# Patient Record
Sex: Male | Born: 1995 | Hispanic: No | Marital: Single | Smoking: Current some day smoker
Health system: Southern US, Community
[De-identification: ages and names within clinical notes are randomized; demographics above are authoritative.]

## PROBLEM LIST (undated history)

## (undated) ENCOUNTER — Emergency Department: Payer: Medicaid Other | Source: Home / Self Care

---
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
## 2017-04-16 ENCOUNTER — Encounter (HOSPITAL_COMMUNITY): Payer: Self-pay | Admitting: Emergency Medicine

## 2017-04-16 ENCOUNTER — Emergency Department (HOSPITAL_COMMUNITY)
Admission: EM | Admit: 2017-04-16 | Discharge: 2017-04-16 | Disposition: A | Discharge status: Home / Self Care | Payer: Medicaid Other | Attending: Emergency Medicine | Admitting: Emergency Medicine

## 2017-04-16 DIAGNOSIS — M7989 Other specified soft tissue disorders: Secondary | ICD-10-CM

## 2017-04-16 DIAGNOSIS — T7840XA Allergy, unspecified, initial encounter: Secondary | ICD-10-CM | POA: Diagnosis not present

## 2017-04-16 DIAGNOSIS — L509 Urticaria, unspecified: Secondary | ICD-10-CM | POA: Diagnosis not present

## 2017-04-16 DIAGNOSIS — R2233 Localized swelling, mass and lump, upper limb, bilateral: Secondary | ICD-10-CM | POA: Insufficient documentation

## 2017-04-16 DIAGNOSIS — R21 Rash and other nonspecific skin eruption: Secondary | ICD-10-CM | POA: Diagnosis present

## 2017-04-16 MED ORDER — EPINEPHRINE 0.3 MG/0.3ML IJ SOAJ: 0.3000 mg | Freq: Once | INTRAMUSCULAR | 0 refills | Status: AC

## 2017-04-16 MED ORDER — DIPHENHYDRAMINE HCL 25 MG PO CAPS
25.0000 mg | ORAL_CAPSULE | Freq: Once | ORAL | Status: AC
  Administered 2017-04-16: 25 mg via ORAL
  Filled 2017-04-16: qty 1

## 2017-04-16 MED ORDER — FAMOTIDINE 40 MG PO TABS: 40.0000 mg | ORAL_TABLET | Freq: Every day | ORAL | 0 refills | Status: DC

## 2017-04-16 MED ORDER — PREDNISONE 10 MG PO TABS: 40.0000 mg | ORAL_TABLET | Freq: Every day | ORAL | 0 refills | Status: AC

## 2017-04-16 MED ORDER — DIPHENHYDRAMINE HCL 25 MG PO CAPS: 25.0000 mg | ORAL_CAPSULE | Freq: Four times a day (QID) | ORAL | 0 refills | Status: DC | PRN

## 2017-04-16 MED ORDER — FAMOTIDINE 20 MG PO TABS
40.0000 mg | ORAL_TABLET | Freq: Once | ORAL | Status: AC
  Administered 2017-04-16: 40 mg via ORAL
  Filled 2017-04-16: qty 2

## 2017-04-16 MED ORDER — PREDNISONE 20 MG PO TABS
60.0000 mg | ORAL_TABLET | Freq: Once | ORAL | Status: AC
  Administered 2017-04-16: 60 mg via ORAL
  Filled 2017-04-16: qty 3

## 2017-04-16 NOTE — ED Provider Notes (Signed)
[facility]-EMERGENCY DEPT Provider Note   CSN: [id number] Arrival date & time: 04/16/17  1337     History   Chief Complaint Chief Complaint  Patient presents with  . Allergic Reaction    HPI John Reynolds is a 21 y.o. male.  HPI  21 year old male in no pertinent past medical history presents to the emergency department with 1 day of bilateral upper extremity swelling with associated rash. He reports that he first noted swelling of his forehead and scalp last night, shortly after eating dinner (hamburger helper - which is something he has had in the past without complication). Reports that the forehead swelling had resolved this morning, but that is when he noted the bilateral upper extremity swelling. He denies any nausea, vomiting, palpitations, shortness of breath, oral swelling.   Patient denies any prior episodes like this. He is unsure of any allergies. Denies any known exposure to ticks, bees, or insects. Works at a [employer]. No known chemical exposure.  History reviewed. No pertinent past medical history.  There are no active problems to display for this patient.   No past surgical history on file.     Home Medications    Prior to Admission medications   Medication Sig Start Date End Date Taking? Authorizing Provider  diphenhydrAMINE (BENADRYL) 25 mg capsule Take 1 capsule (25 mg total) by mouth every 6 (six) hours as needed for itching or allergies. 04/16/17 04/21/17  Nira Conn, MD  EPINEPHrine 0.3 mg/0.3 mL IJ SOAJ injection Inject 0.3 mLs (0.3 mg total) into the muscle once. 04/16/17 04/16/17  Nira Conn, MD  famotidine (PEPCID) 40 MG tablet Take 1 tablet (40 mg total) by mouth daily. 04/16/17 04/21/17  Nira Conn, MD  predniSONE (DELTASONE) 10 MG tablet Take 4 tablets (40 mg total) by mouth daily. 04/16/17 04/20/17  Nira Conn, MD    Family History Family History  Problem Relation Age of Onset  . Hyperlipidemia  Father   . Hypertension Father   . Cancer Maternal Grandmother     Social History Social History  Substance Use Topics  . Smoking status: Never Smoker  . Smokeless tobacco: Not on file  . Alcohol use No     Allergies   Patient has no known allergies.   Review of Systems Review of Systems All other systems are reviewed and are negative for acute change except as noted in the HPI   Physical Exam Updated Vital Signs BP 133/64   Pulse 100   Temp 98.6 F (37 C) (Oral)   Resp 18   SpO2 100%   Physical Exam  Constitutional: He is oriented to person, place, and time. He appears well-developed and well-nourished. No distress.  HENT:  Head: Normocephalic and atraumatic.  Nose: Nose normal.  Mouth/Throat: Oropharynx is clear and moist and mucous membranes are normal. No oral lesions. No trismus in the jaw. No uvula swelling.  Eyes: Pupils are equal, round, and reactive to light. Conjunctivae and EOM are normal. Right eye exhibits no discharge. Left eye exhibits no discharge. No scleral icterus.  Neck: Normal range of motion and phonation normal. Neck supple.  Cardiovascular: Normal rate and regular rhythm.  Exam reveals no gallop and no friction rub.   No murmur heard. Pulmonary/Chest: Effort normal and breath sounds normal. No stridor. No tachypnea. No respiratory distress. He has no decreased breath sounds. He has no wheezes. He has no rales.  Abdominal: Soft. He exhibits no distension. There is no tenderness.  Musculoskeletal: He exhibits no edema or tenderness.  BUE swelling from hands to elbow. R>L.   Neurological: He is alert and oriented to person, place, and time.  Skin: Skin is warm and dry. Rash noted. Rash is urticarial (Urticaria to BUE, abdomen, BLE.). He is not diaphoretic. No erythema.  Psychiatric: He has a normal mood and affect.  Vitals reviewed.    ED Treatments / Results  Labs (all labs ordered are listed, but only abnormal results are displayed) Labs  Reviewed - No data to display  EKG  EKG Interpretation None       Radiology No results found.  Procedures Procedures (including critical care time)  Medications Ordered in ED Medications  diphenhydrAMINE (BENADRYL) capsule 25 mg (25 mg Oral Given 04/16/17 1520)  famotidine (PEPCID) tablet 40 mg (40 mg Oral Given 04/16/17 1520)  predniSONE (DELTASONE) tablet 60 mg (60 mg Oral Given 04/16/17 1519)     Initial Impression / Assessment and Plan / ED Course  I have reviewed the triage vital signs and the nursing notes.  Pertinent labs & imaging results that were available during my care of the patient were reviewed by me and considered in my medical decision making (see chart for details).     21 y.o. male here with pruritic rash. No known triggers or exposures. No respiratory, GI, or neurologic symptoms to suggest anaphylaxis. No recent infectious symptoms suggestive of viral urticaria.  Patient has not taken benadryl prior to arrival.   On exam, there is no evidence of oral swelling or airway compromise.   Given Benadryl, H2 blocker, and steroids. Monitored for 2 hours. Swelling and urticaria improving. No respiratory distress. Given the fact that the symptoms started over 12 hrs ago, there is no need for prolonged monitoring.  Safe for discharge with strict return precautions. Given Rx for H2 blocker and steroids.   Final Clinical Impressions(s) / ED Diagnoses   Final diagnoses:  Allergic reaction, initial encounter  Hives  Arm swelling   Disposition: Discharge  Condition: Good  I have discussed the results, Dx and Tx plan with the patient who expressed understanding and agree(s) with the plan. Discharge instructions discussed at great length. The patient was given strict return precautions who verbalized understanding of the instructions. No further questions at time of discharge.    New Prescriptions   DIPHENHYDRAMINE (BENADRYL) 25 MG CAPSULE    Take 1 capsule (25 mg  total) by mouth every 6 (six) hours as needed for itching or allergies.   EPINEPHRINE 0.3 MG/0.3 ML IJ SOAJ INJECTION    Inject 0.3 mLs (0.3 mg total) into the muscle once.   FAMOTIDINE (PEPCID) 40 MG TABLET    Take 1 tablet (40 mg total) by mouth daily.   PREDNISONE (DELTASONE) 10 MG TABLET    Take 4 tablets (40 mg total) by mouth daily.    Follow Up: Primary care provider   You will need to be referred to an allergist to figure out what you may be allergic to.      Nira Conn, MD 04/16/17 [phone]

## 2017-04-16 NOTE — ED Triage Notes (Signed)
Pt checked in for arm swelling. Upon assessment pt right arm is tight, swelling to arm and right hand noted. Rash noted to bilateral arms. When asked pt about rash he states "oh yea I had that since this morning I just put cream on it and I put my antibacterial cream on it." Pt assessed for allergic reaction. Denies any known allergies, pt works in a warehouse. Denis trouble breathing, denies sore throat or tongue swelling. Pt now states his eyelids and forehead were swollen yesterday.  Pt is in no acute distress. Has not taken any benadryl. Did take 2 advil for pain to arm. HR ranging from 98-106 at triage.

## 2021-05-20 ENCOUNTER — Emergency Department (HOSPITAL_COMMUNITY)
Admission: EM | Admit: 2021-05-20 | Discharge: 2021-05-20 | Disposition: A | Discharge status: Home / Self Care | Payer: Self-pay | Attending: Emergency Medicine | Admitting: Emergency Medicine

## 2021-05-20 ENCOUNTER — Encounter (HOSPITAL_COMMUNITY): Payer: Self-pay | Admitting: Oncology

## 2021-05-20 ENCOUNTER — Emergency Department (HOSPITAL_COMMUNITY): Payer: Self-pay

## 2021-05-20 ENCOUNTER — Other Ambulatory Visit: Payer: Self-pay

## 2021-05-20 DIAGNOSIS — R1010 Upper abdominal pain, unspecified: Secondary | ICD-10-CM

## 2021-05-20 DIAGNOSIS — R1110 Vomiting, unspecified: Secondary | ICD-10-CM | POA: Insufficient documentation

## 2021-05-20 DIAGNOSIS — R1012 Left upper quadrant pain: Secondary | ICD-10-CM | POA: Insufficient documentation

## 2021-05-20 LAB — COMPREHENSIVE METABOLIC PANEL
ALT: 20 U/L (ref 0–44)
AST: 17 U/L (ref 15–41)
Albumin: 4.5 g/dL (ref 3.5–5.0)
Alkaline Phosphatase: 56 U/L (ref 38–126)
Anion gap: 11 (ref 5–15)
BUN: 14 mg/dL (ref 6–20)
CO2: 22 mmol/L (ref 22–32)
Calcium: 9.2 mg/dL (ref 8.9–10.3)
Chloride: 103 mmol/L (ref 98–111)
Creatinine, Ser: 0.84 mg/dL (ref 0.61–1.24)
GFR, Estimated: >60 mL/min (ref >60)
Glucose, Bld: 96 mg/dL (ref 70–99)
Potassium: 3.6 mmol/L (ref 3.5–5.1)
Sodium: 136 mmol/L (ref 135–145)
Total Bilirubin: 1 mg/dL (ref 0.3–1.2)
Total Protein: 7.7 g/dL (ref 6.5–8.1)

## 2021-05-20 LAB — CBC
HCT: 43.5 % (ref 39.0–52.0)
Hemoglobin: 15.5 g/dL (ref 13.0–17.0)
MCH: 32 pg (ref 26.0–34.0)
MCHC: 35.6 g/dL (ref 30.0–36.0)
MCV: 89.9 fL (ref 80.0–100.0)
Platelets: 235 10*3/uL (ref 150–400)
RBC: 4.84 MIL/uL (ref 4.22–5.81)
RDW: 12.5 % (ref 11.5–15.5)
WBC: 12.5 10*3/uL — ABNORMAL HIGH (ref 4.0–10.5)
nRBC: 0 % (ref 0.0–0.2)

## 2021-05-20 LAB — LIPASE, BLOOD: Lipase: 23 U/L (ref 11–51)

## 2021-05-20 MED ORDER — IOHEXOL 350 MG/ML SOLN
75.0000 mL | Freq: Once | INTRAVENOUS | Status: AC | PRN
  Administered 2021-05-20: 75 mL via INTRAVENOUS

## 2021-05-20 MED ORDER — SODIUM CHLORIDE 0.9 % IV BOLUS
1000.0000 mL | Freq: Once | INTRAVENOUS | Status: AC
  Administered 2021-05-20: 1000 mL via INTRAVENOUS

## 2021-05-20 MED ORDER — METOCLOPRAMIDE HCL 5 MG/ML IJ SOLN
10.0000 mg | Freq: Once | INTRAMUSCULAR | Status: AC
  Administered 2021-05-20: 10 mg via INTRAVENOUS
  Filled 2021-05-20: qty 2

## 2021-05-20 MED ORDER — ONDANSETRON 4 MG PO TBDP: 4.0000 mg | ORAL_TABLET | Freq: Three times a day (TID) | ORAL | 0 refills | Status: AC | PRN

## 2021-05-20 MED ORDER — HYDROMORPHONE HCL 1 MG/ML IJ SOLN
1.0000 mg | Freq: Once | INTRAMUSCULAR | Status: AC
  Administered 2021-05-20: 1 mg via INTRAVENOUS
  Filled 2021-05-20: qty 1

## 2021-05-20 MED ORDER — PANTOPRAZOLE SODIUM 40 MG PO TBEC: 40.0000 mg | DELAYED_RELEASE_TABLET | Freq: Every day | ORAL | 0 refills | Status: AC

## 2021-05-20 NOTE — ED Triage Notes (Signed)
Pt bib [facility] d/t LUQ abdominal pain and vomiting that began last night.  Pt given 100 mcg of fentanyl and 350 ml's of NS en route.

## 2021-05-20 NOTE — Discharge Instructions (Signed)
If you develop worsening, continued, or recurrent abdominal pain, uncontrolled vomiting, fever, chest or back pain, or any other new/concerning symptoms then return to the ER for evaluation.  

## 2021-05-20 NOTE — ED Provider Notes (Signed)
[facility]-EMERGENCY DEPT Provider Note   CSN: [id number] Arrival date & time: 05/20/21  1050     History Chief Complaint  Patient presents with   Abdominal Pain    John Reynolds is a 25 y.o. male.  HPI 25 year old male presents with left upper quadrant abdominal pain.  Started last night around 8 PM.  It is sharp and severe.  Somewhat of a burning sensation.  He has not had a fever but has had numerous episodes of emesis.  Is mostly yellow with specks of blood.  No diarrhea or back pain.  No urinary symptoms.  No chest pain.  He does endorse using marijuana once every couple weeks.  He denies any significant alcohol use.  Pain is rated as severe currently.  He was given IV fentanyl by EMS.  History reviewed. No pertinent past medical history.  There are no problems to display for this patient.   History reviewed. No pertinent surgical history.     Family History  Problem Relation Age of Onset   Hyperlipidemia Father    Hypertension Father    Cancer Maternal Grandmother     Social History   Tobacco Use   Smoking status: Never   Smokeless tobacco: Never  Substance Use Topics   Alcohol use: No    Home Medications Prior to Admission medications   Medication Sig Start Date End Date Taking? Authorizing Provider  ondansetron (ZOFRAN ODT) 4 MG disintegrating tablet Take 1 tablet (4 mg total) by mouth every 8 (eight) hours as needed for nausea or vomiting. 05/20/21  Yes Pricilla Loveless, MD  pantoprazole (PROTONIX) 40 MG tablet Take 1 tablet (40 mg total) by mouth daily. 05/20/21  Yes Pricilla Loveless, MD  diphenhydrAMINE (BENADRYL) 25 mg capsule Take 1 capsule (25 mg total) by mouth every 6 (six) hours as needed for itching or allergies. 04/16/17 04/21/17  Nira Conn, MD  famotidine (PEPCID) 40 MG tablet Take 1 tablet (40 mg total) by mouth daily. 04/16/17 04/21/17  Nira Conn, MD    Allergies    Patient has no known  allergies.  Review of Systems   Review of Systems  Constitutional:  Negative for fever.  Cardiovascular:  Negative for chest pain.  Gastrointestinal:  Positive for abdominal pain, nausea and vomiting.  Musculoskeletal:  Negative for back pain.  All other systems reviewed and are negative.  Physical Exam Updated Vital Signs BP 108/61   Pulse (!) 57   Temp 98.6 F (37 C) (Oral)   Resp 18   SpO2 100%   Physical Exam Vitals and nursing note reviewed.  Constitutional:      Appearance: He is well-developed.     Comments: Patient appears uncomfortable, hard for him to lay still  HENT:     Head: Normocephalic and atraumatic.     Right Ear: External ear normal.     Left Ear: External ear normal.     Nose: Nose normal.  Eyes:     General:        Right eye: No discharge.        Left eye: No discharge.  Cardiovascular:     Rate and Rhythm: Normal rate and regular rhythm.     Heart sounds: Normal heart sounds.  Pulmonary:     Effort: Pulmonary effort is normal.     Breath sounds: Normal breath sounds.  Abdominal:     Palpations: Abdomen is soft.     Tenderness: There is abdominal tenderness in the  left upper quadrant.  Musculoskeletal:     Cervical back: Neck supple.  Skin:    General: Skin is warm and dry.  Neurological:     Mental Status: He is alert.  Psychiatric:        Mood and Affect: Mood is not anxious.    ED Results / Procedures / Treatments   Labs (all labs ordered are listed, but only abnormal results are displayed) Labs Reviewed  CBC - Abnormal; Notable for the following components:      Result Value   WBC 12.5 (*)    All other components within normal limits  LIPASE, BLOOD  COMPREHENSIVE METABOLIC PANEL  URINALYSIS, ROUTINE W REFLEX MICROSCOPIC    EKG EKG Interpretation  Date/Time:  Thursday May 20 2021 12:02:58 EST Ventricular Rate:  50 PR Interval:  145 QRS Duration: 93 QT Interval:  426 QTC Calculation: 389 R Axis:   80 Text  Interpretation: Sinus rhythm Atrial premature complex no acute ST/T changes Confirmed by Pricilla Loveless [phone]) on 05/20/2021 12:06:20 PM  Radiology CT ABDOMEN PELVIS W CONTRAST  Result Date: 05/20/2021 CLINICAL DATA:  Nausea and vomiting.  Left upper quadrant pain. EXAM: CT ABDOMEN AND PELVIS WITH CONTRAST TECHNIQUE: Multidetector CT imaging of the abdomen and pelvis was performed using the standard protocol following bolus administration of intravenous contrast. CONTRAST:  27mL OMNIPAQUE IOHEXOL 350 MG/ML SOLN COMPARISON:  None. FINDINGS: Lower chest: No acute abnormality. Hepatobiliary: No focal liver abnormality is seen. No gallstones, gallbladder wall thickening, or biliary dilatation. Pancreas: Unremarkable. No pancreatic ductal dilatation or surrounding inflammatory changes. Spleen: Normal in size without focal abnormality. Adrenals/Urinary Tract: Adrenal glands are unremarkable. Kidneys are normal, without renal calculi, focal lesion, or hydronephrosis. Bladder is unremarkable. Stomach/Bowel: Stomach appears normal. The appendix is visualized and appears normal. No signs of bowel wall thickening, inflammation, or distension. Vascular/Lymphatic: No significant vascular findings are present. No enlarged abdominal or pelvic lymph nodes. Reproductive: Prostate is unremarkable. Other: No free fluid or fluid collections identified. Musculoskeletal: No acute or significant osseous findings. IMPRESSION: No acute findings within the abdomen or pelvis. Electronically Signed   By: Signa Kell M.D.   On: 05/20/2021 14:25    Procedures Procedures   Medications Ordered in ED Medications  sodium chloride 0.9 % bolus 1,000 mL (0 mLs Intravenous Stopped 05/20/21 1448)  HYDROmorphone (DILAUDID) injection 1 mg (1 mg Intravenous Given 05/20/21 1208)  metoCLOPramide (REGLAN) injection 10 mg (10 mg Intravenous Given 05/20/21 1208)  iohexol (OMNIPAQUE) 350 MG/ML injection 75 mL (75 mLs Intravenous Contrast  Given 05/20/21 1410)    ED Course  I have reviewed the triage vital signs and the nursing notes.  Pertinent labs & imaging results that were available during my care of the patient were reviewed by me and considered in my medical decision making (see chart for details).    MDM Rules/Calculators/A&P                           Patient feels much better after treatment with IV Dilaudid and Reglan.  No vomiting.  He does have some specks of blood in his emesis earlier but I suspect this is from the vomiting itself rather than true hematemesis or esophageal perforation.  Unclear cause but with a primary ring left upper quadrant think is reasonable to treat as potentially gastritis with Protonix and Zofran and have him follow-up with his PCP.  Given return precautions. Final Clinical Impression(s) / ED Diagnoses Final diagnoses:  Vomiting in adult  Upper abdominal pain    Rx / DC Orders ED Discharge Orders          Ordered    ondansetron (ZOFRAN ODT) 4 MG disintegrating tablet  Every 8 hours PRN        05/20/21 1432    pantoprazole (PROTONIX) 40 MG tablet  Daily        05/20/21 1432             Pricilla Loveless, MD 05/20/21 1512

## 2021-05-20 NOTE — ED Notes (Signed)
Pt given urinal, will give sample when able too.

## 2021-05-21 ENCOUNTER — Other Ambulatory Visit: Payer: Self-pay

## 2021-05-21 ENCOUNTER — Emergency Department (HOSPITAL_COMMUNITY)
Admission: EM | Admit: 2021-05-21 | Discharge: 2021-05-21 | Disposition: A | Discharge status: Home / Self Care | Payer: Medicaid Other | Attending: Emergency Medicine | Admitting: Emergency Medicine

## 2021-05-21 ENCOUNTER — Encounter (HOSPITAL_COMMUNITY): Payer: Self-pay

## 2021-05-21 DIAGNOSIS — R112 Nausea with vomiting, unspecified: Secondary | ICD-10-CM | POA: Insufficient documentation

## 2021-05-21 DIAGNOSIS — Z79899 Other long term (current) drug therapy: Secondary | ICD-10-CM | POA: Insufficient documentation

## 2021-05-21 DIAGNOSIS — F17210 Nicotine dependence, cigarettes, uncomplicated: Secondary | ICD-10-CM | POA: Insufficient documentation

## 2021-05-21 DIAGNOSIS — R1012 Left upper quadrant pain: Secondary | ICD-10-CM | POA: Insufficient documentation

## 2021-05-21 DIAGNOSIS — Z20822 Contact with and (suspected) exposure to covid-19: Secondary | ICD-10-CM | POA: Insufficient documentation

## 2021-05-21 LAB — URINALYSIS, ROUTINE W REFLEX MICROSCOPIC
Bacteria, UA: NONE SEEN
Bilirubin Urine: NEGATIVE
Glucose, UA: NEGATIVE mg/dL
Hgb urine dipstick: NEGATIVE
Ketones, ur: 80 mg/dL — AB
Leukocytes,Ua: NEGATIVE
Nitrite: NEGATIVE
Protein, ur: 30 mg/dL — AB
Specific Gravity, Urine: 1.035 — ABNORMAL HIGH (ref 1.005–1.030)
pH: 5 (ref 5.0–8.0)

## 2021-05-21 LAB — COMPREHENSIVE METABOLIC PANEL
ALT: 19 U/L (ref 0–44)
AST: 17 U/L (ref 15–41)
Albumin: 4 g/dL (ref 3.5–5.0)
Alkaline Phosphatase: 53 U/L (ref 38–126)
Anion gap: 10 (ref 5–15)
BUN: 13 mg/dL (ref 6–20)
CO2: 23 mmol/L (ref 22–32)
Calcium: 9 mg/dL (ref 8.9–10.3)
Chloride: 103 mmol/L (ref 98–111)
Creatinine, Ser: 1 mg/dL (ref 0.61–1.24)
GFR, Estimated: >60 mL/min (ref >60)
Glucose, Bld: 98 mg/dL (ref 70–99)
Potassium: 3.3 mmol/L — ABNORMAL LOW (ref 3.5–5.1)
Sodium: 136 mmol/L (ref 135–145)
Total Bilirubin: 1 mg/dL (ref 0.3–1.2)
Total Protein: 6.9 g/dL (ref 6.5–8.1)

## 2021-05-21 LAB — CBC WITH DIFFERENTIAL/PLATELET
Abs Immature Granulocytes: 0.06 10*3/uL (ref 0.00–0.07)
Basophils Absolute: 0 10*3/uL (ref 0.0–0.1)
Basophils Relative: 0 %
Eosinophils Absolute: 0.1 10*3/uL (ref 0.0–0.5)
Eosinophils Relative: 0 %
HCT: 42.5 % (ref 39.0–52.0)
Hemoglobin: 15.3 g/dL (ref 13.0–17.0)
Immature Granulocytes: 0 %
Lymphocytes Relative: 13 %
Lymphs Abs: 1.8 10*3/uL (ref 0.7–4.0)
MCH: 32 pg (ref 26.0–34.0)
MCHC: 36 g/dL (ref 30.0–36.0)
MCV: 88.9 fL (ref 80.0–100.0)
Monocytes Absolute: 1 10*3/uL (ref 0.1–1.0)
Monocytes Relative: 7 %
Neutro Abs: 11.5 10*3/uL — ABNORMAL HIGH (ref 1.7–7.7)
Neutrophils Relative %: 80 %
Platelets: 217 10*3/uL (ref 150–400)
RBC: 4.78 MIL/uL (ref 4.22–5.81)
RDW: 12.5 % (ref 11.5–15.5)
WBC: 14.4 10*3/uL — ABNORMAL HIGH (ref 4.0–10.5)
nRBC: 0 % (ref 0.0–0.2)

## 2021-05-21 LAB — RESP PANEL BY RT-PCR (FLU A&B, COVID) ARPGX2
Influenza A by PCR: NEGATIVE
Influenza B by PCR: NEGATIVE
SARS Coronavirus 2 by RT PCR: NEGATIVE

## 2021-05-21 LAB — RAPID URINE DRUG SCREEN, HOSP PERFORMED
Amphetamines: NOT DETECTED
Barbiturates: NOT DETECTED
Benzodiazepines: NOT DETECTED
Cocaine: NOT DETECTED
Opiates: POSITIVE — AB
Tetrahydrocannabinol: POSITIVE — AB

## 2021-05-21 LAB — LIPASE, BLOOD: Lipase: 27 U/L (ref 11–51)

## 2021-05-21 LAB — LACTIC ACID, PLASMA: Lactic Acid, Venous: 1 mmol/L (ref 0.5–1.9)

## 2021-05-21 MED ORDER — HYDROMORPHONE HCL 1 MG/ML IJ SOLN
1.0000 mg | Freq: Once | INTRAMUSCULAR | Status: AC
  Administered 2021-05-21: 1 mg via INTRAVENOUS
  Filled 2021-05-21: qty 1

## 2021-05-21 MED ORDER — HALOPERIDOL LACTATE 5 MG/ML IJ SOLN: 2.0000 mg | Freq: Once | INTRAMUSCULAR | Status: DC

## 2021-05-21 MED ORDER — HALOPERIDOL LACTATE 5 MG/ML IJ SOLN
2.5000 mg | Freq: Once | INTRAMUSCULAR | Status: AC
  Administered 2021-05-21: 2.5 mg via INTRAVENOUS
  Filled 2021-05-21: qty 1

## 2021-05-21 MED ORDER — SODIUM CHLORIDE 0.9 % IV BOLUS
1000.0000 mL | Freq: Once | INTRAVENOUS | Status: AC
  Administered 2021-05-21: 1000 mL via INTRAVENOUS

## 2021-05-21 NOTE — ED Triage Notes (Signed)
Patient BIB [facility] from home. Was seen yesterday at [facility] for abdominal pain that has you not resolved. Generalized. Pain began at 1:40am, vomited before EMS. Pain is worse than initial onset. Diaphoretic.   EMS 18G left AC Fentanyl (pain went from 10 to 8).  HR 80 94% Room air BP 106/74

## 2021-05-21 NOTE — ED Provider Notes (Signed)
[facility]-EMERGENCY DEPT Provider Note: John Dell, MD, FACEP  CSN: [id number] MRN: [id number] ARRIVAL: 05/21/21 at 0310 ROOM: WA15/WA15   CHIEF COMPLAINT  Abdominal Pain   HISTORY OF PRESENT ILLNESS  05/21/21 3:19 AM John Reynolds is a 25 y.o. male who was seen for abdominal pain yesterday afternoon.  The pain was in the left upper quadrant and it started the previous evening about 8 PM.  It was described as sharp and severe.  He had associated nausea and vomiting but no diarrhea.  Results of diagnostic studies from yesterday are below:  Results for orders placed or performed during the hospital encounter of 05/20/21 (from the past 24 hour(s))  Lipase, blood     Status: None   Collection Time: 05/20/21 12:11 PM  Result Value Ref Range   Lipase 23 11 - 51 U/L  Comprehensive metabolic panel     Status: None   Collection Time: 05/20/21 12:11 PM  Result Value Ref Range   Sodium 136 135 - 145 mmol/L   Potassium 3.6 3.5 - 5.1 mmol/L   Chloride 103 98 - 111 mmol/L   CO2 22 22 - 32 mmol/L   Glucose, Bld 96 70 - 99 mg/dL   BUN 14 6 - 20 mg/dL   Creatinine, Ser 6.43 0.61 - 1.24 mg/dL   Calcium 9.2 8.9 - 32.9 mg/dL   Total Protein 7.7 6.5 - 8.1 g/dL   Albumin 4.5 3.5 - 5.0 g/dL   AST 17 15 - 41 U/L   ALT 20 0 - 44 U/L   Alkaline Phosphatase 56 38 - 126 U/L   Total Bilirubin 1.0 0.3 - 1.2 mg/dL   GFR, Estimated >51 >88 mL/min   Anion gap 11 5 - 15  CBC     Status: Abnormal   Collection Time: 05/20/21 12:11 PM  Result Value Ref Range   WBC 12.5 (H) 4.0 - 10.5 K/uL   RBC 4.84 4.22 - 5.81 MIL/uL   Hemoglobin 15.5 13.0 - 17.0 g/dL   HCT 41.6 60.6 - 30.1 %   MCV 89.9 80.0 - 100.0 fL   MCH 32.0 26.0 - 34.0 pg   MCHC 35.6 30.0 - 36.0 g/dL   RDW 60.1 09.3 - 23.5 %   Platelets 235 150 - 400 K/uL   nRBC 0.0 0.0 - 0.2 %   CT ABDOMEN PELVIS W CONTRAST  Result Date: 05/20/2021 CLINICAL DATA:  Nausea and vomiting.  Left upper quadrant pain. EXAM: CT ABDOMEN AND PELVIS WITH CONTRAST  TECHNIQUE: Multidetector CT imaging of the abdomen and pelvis was performed using the standard protocol following bolus administration of intravenous contrast. CONTRAST:  85mL OMNIPAQUE IOHEXOL 350 MG/ML SOLN COMPARISON:  None. FINDINGS: Lower chest: No acute abnormality. Hepatobiliary: No focal liver abnormality is seen. No gallstones, gallbladder wall thickening, or biliary dilatation. Pancreas: Unremarkable. No pancreatic ductal dilatation or surrounding inflammatory changes. Spleen: Normal in size without focal abnormality. Adrenals/Urinary Tract: Adrenal glands are unremarkable. Kidneys are normal, without renal calculi, focal lesion, or hydronephrosis. Bladder is unremarkable. Stomach/Bowel: Stomach appears normal. The appendix is visualized and appears normal. No signs of bowel wall thickening, inflammation, or distension. Vascular/Lymphatic: No significant vascular findings are present. No enlarged abdominal or pelvic lymph nodes. Reproductive: Prostate is unremarkable. Other: No free fluid or fluid collections identified. Musculoskeletal: No acute or significant osseous findings. IMPRESSION: No acute findings within the abdomen or pelvis. Electronically Signed   By: Signa Kell M.D.   On: 05/20/2021 14:25    He returns  with a recurrence of abdominal pain that began about 1:40 AM today.  The pain is still in the left upper quadrant but now severe.  He rated it as a 10 out of 10, worse than yesterday.  He had had vomiting (retching) with this and EMS found him to be diaphoretic.  He was given 200 mcg of fentanyl IV by EMS and his pain is now an 8 out of 10.  He describes it as aching and constant.  The pain is only somewhat worse with palpation.  He denies frequent marijuana use and has not smoked marijuana in about 3 days.   History reviewed. No pertinent past medical history.  History reviewed. No pertinent surgical history.  Family History  Problem Relation Age of Onset   Hyperlipidemia  Father    Hypertension Father    Cancer Maternal Grandmother     Social History   Tobacco Use   Smoking status: Some Days    Types: Cigarettes   Smokeless tobacco: Never  Substance Use Topics   Alcohol use: No   Drug use: Yes    Types: Marijuana    Comment: occassionally    Prior to Admission medications   Medication Sig Start Date End Date Taking? Authorizing Provider  ondansetron (ZOFRAN ODT) 4 MG disintegrating tablet Take 1 tablet (4 mg total) by mouth every 8 (eight) hours as needed for nausea or vomiting. 05/20/21   Pricilla Loveless, MD  pantoprazole (PROTONIX) 40 MG tablet Take 1 tablet (40 mg total) by mouth daily. 05/20/21   Pricilla Loveless, MD    Allergies Patient has no known allergies.   REVIEW OF SYSTEMS  Negative except as noted here or in the History of Present Illness.   PHYSICAL EXAMINATION  Initial Vital Signs Blood pressure 137/60, pulse 87, temperature 98.6 F (37 C), temperature source Oral, resp. rate 17, height 5' 7.5" (1.715 m), weight 77.1 kg, SpO2 96 %.  Examination General: Well-developed, well-nourished male in no acute distress; appearance consistent with age of record HENT: normocephalic; atraumatic Eyes: Normal appearance Neck: supple Heart: regular rate and rhythm Lungs: clear to auscultation bilaterally Abdomen: Nondistended; diffusely tender most prominent in the left upper quadrant; bowel sounds hypoactive Extremities: No deformity; full range of motion; pulses normal Neurologic: Awake, alert and oriented; motor function intact in all extremities and symmetric; no facial droop Skin: Warm and dry Psychiatric: Agitated; writhing   RESULTS  Summary of this visit's results, reviewed and interpreted by myself:   EKG Interpretation  Date/Time:    Ventricular Rate:    PR Interval:    QRS Duration:   QT Interval:    QTC Calculation:   R Axis:     Text Interpretation:         Laboratory Studies: Results for orders placed or  performed during the hospital encounter of 05/21/21 (from the past 24 hour(s))  CBC with Differential/Platelet     Status: Abnormal   Collection Time: 05/21/21  3:43 AM  Result Value Ref Range   WBC 14.4 (H) 4.0 - 10.5 K/uL   RBC 4.78 4.22 - 5.81 MIL/uL   Hemoglobin 15.3 13.0 - 17.0 g/dL   HCT 56.3 89.3 - 73.4 %   MCV 88.9 80.0 - 100.0 fL   MCH 32.0 26.0 - 34.0 pg   MCHC 36.0 30.0 - 36.0 g/dL   RDW 28.7 68.1 - 15.7 %   Platelets 217 150 - 400 K/uL   nRBC 0.0 0.0 - 0.2 %   Neutrophils Relative %  80 %   Neutro Abs 11.5 (H) 1.7 - 7.7 K/uL   Lymphocytes Relative 13 %   Lymphs Abs 1.8 0.7 - 4.0 K/uL   Monocytes Relative 7 %   Monocytes Absolute 1.0 0.1 - 1.0 K/uL   Eosinophils Relative 0 %   Eosinophils Absolute 0.1 0.0 - 0.5 K/uL   Basophils Relative 0 %   Basophils Absolute 0.0 0.0 - 0.1 K/uL   Immature Granulocytes 0 %   Abs Immature Granulocytes 0.06 0.00 - 0.07 K/uL  Comprehensive metabolic panel     Status: Abnormal   Collection Time: 05/21/21  3:43 AM  Result Value Ref Range   Sodium 136 135 - 145 mmol/L   Potassium 3.3 (L) 3.5 - 5.1 mmol/L   Chloride 103 98 - 111 mmol/L   CO2 23 22 - 32 mmol/L   Glucose, Bld 98 70 - 99 mg/dL   BUN 13 6 - 20 mg/dL   Creatinine, Ser 5.10 0.61 - 1.24 mg/dL   Calcium 9.0 8.9 - 25.8 mg/dL   Total Protein 6.9 6.5 - 8.1 g/dL   Albumin 4.0 3.5 - 5.0 g/dL   AST 17 15 - 41 U/L   ALT 19 0 - 44 U/L   Alkaline Phosphatase 53 38 - 126 U/L   Total Bilirubin 1.0 0.3 - 1.2 mg/dL   GFR, Estimated >52 >77 mL/min   Anion gap 10 5 - 15  Lipase, blood     Status: None   Collection Time: 05/21/21  3:43 AM  Result Value Ref Range   Lipase 27 11 - 51 U/L  Lactic acid, plasma     Status: None   Collection Time: 05/21/21  3:43 AM  Result Value Ref Range   Lactic Acid, Venous 1.0 0.5 - 1.9 mmol/L  Resp Panel by RT-PCR (Flu A&B, Covid) Nasopharyngeal Swab     Status: None   Collection Time: 05/21/21  3:50 AM   Specimen: Nasopharyngeal Swab;  Nasopharyngeal(NP) swabs in vial transport medium  Result Value Ref Range   SARS Coronavirus 2 by RT PCR NEGATIVE NEGATIVE   Influenza A by PCR NEGATIVE NEGATIVE   Influenza B by PCR NEGATIVE NEGATIVE  Urinalysis, Routine w reflex microscopic     Status: Abnormal   Collection Time: 05/21/21  4:35 AM  Result Value Ref Range   Color, Urine YELLOW YELLOW   APPearance CLEAR CLEAR   Specific Gravity, Urine 1.035 (H) 1.005 - 1.030   pH 5.0 5.0 - 8.0   Glucose, UA NEGATIVE NEGATIVE mg/dL   Hgb urine dipstick NEGATIVE NEGATIVE   Bilirubin Urine NEGATIVE NEGATIVE   Ketones, ur 80 (A) NEGATIVE mg/dL   Protein, ur 30 (A) NEGATIVE mg/dL   Nitrite NEGATIVE NEGATIVE   Leukocytes,Ua NEGATIVE NEGATIVE   RBC / HPF 0-5 0 - 5 RBC/hpf   WBC, UA 0-5 0 - 5 WBC/hpf   Bacteria, UA NONE SEEN NONE SEEN   Squamous Epithelial / LPF 0-5 0 - 5   Mucus PRESENT   Rapid urine drug screen (hospital performed)     Status: Abnormal   Collection Time: 05/21/21  4:35 AM  Result Value Ref Range   Opiates POSITIVE (A) NONE DETECTED   Cocaine NONE DETECTED NONE DETECTED   Benzodiazepines NONE DETECTED NONE DETECTED   Amphetamines NONE DETECTED NONE DETECTED   Tetrahydrocannabinol POSITIVE (A) NONE DETECTED   Barbiturates NONE DETECTED NONE DETECTED   Imaging Studies: CT ABDOMEN PELVIS W CONTRAST  Result Date: 05/20/2021 CLINICAL DATA:  Nausea  and vomiting.  Left upper quadrant pain. EXAM: CT ABDOMEN AND PELVIS WITH CONTRAST TECHNIQUE: Multidetector CT imaging of the abdomen and pelvis was performed using the standard protocol following bolus administration of intravenous contrast. CONTRAST:  71mL OMNIPAQUE IOHEXOL 350 MG/ML SOLN COMPARISON:  None. FINDINGS: Lower chest: No acute abnormality. Hepatobiliary: No focal liver abnormality is seen. No gallstones, gallbladder wall thickening, or biliary dilatation. Pancreas: Unremarkable. No pancreatic ductal dilatation or surrounding inflammatory changes. Spleen: Normal in  size without focal abnormality. Adrenals/Urinary Tract: Adrenal glands are unremarkable. Kidneys are normal, without renal calculi, focal lesion, or hydronephrosis. Bladder is unremarkable. Stomach/Bowel: Stomach appears normal. The appendix is visualized and appears normal. No signs of bowel wall thickening, inflammation, or distension. Vascular/Lymphatic: No significant vascular findings are present. No enlarged abdominal or pelvic lymph nodes. Reproductive: Prostate is unremarkable. Other: No free fluid or fluid collections identified. Musculoskeletal: No acute or significant osseous findings. IMPRESSION: No acute findings within the abdomen or pelvis. Electronically Signed   By: Signa Kell M.D.   On: 05/20/2021 14:25    ED COURSE and MDM  Nursing notes, initial and subsequent vitals signs, including pulse oximetry, reviewed and interpreted by myself.  Vitals:   05/21/21 0315 05/21/21 0318 05/21/21 0430 05/21/21 0500  BP:  137/60 112/69 127/61  Pulse:  87 (!) 46 (!) 54  Resp:  17 (!) 22 17  Temp:  98.6 F (37 C)    TempSrc:  Oral    SpO2:  96% 96% 99%  Weight: 77.1 kg     Height: 5' 7.5" (1.715 m)      Medications  sodium chloride 0.9 % bolus 1,000 mL (0 mLs Intravenous Stopped 05/21/21 0432)  HYDROmorphone (DILAUDID) injection 1 mg (1 mg Intravenous Given 05/21/21 0346)  haloperidol lactate (HALDOL) injection 2.5 mg (2.5 mg Intravenous Given 05/21/21 0345)   5:29 AM Patient feeling difficulty better after IV medications.  He tested positive for [facility] and admitted to nursing staff that he has been smoking marijuana.  Nursing staff also reports he smells of marijuana smoke.  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   PROCEDURES  Procedures   ED DIAGNOSES     ICD-10-CM   1. Left upper quadrant abdominal pain  R10.12          Debanhi Blaker, Jonny Ruiz, MD 05/21/21 0530

## 2021-08-22 ENCOUNTER — Emergency Department (HOSPITAL_COMMUNITY)
Admission: EM | Admit: 2021-08-22 | Discharge: 2021-08-22 | Disposition: A | Discharge status: Home / Self Care | Payer: Medicaid Other | Attending: Emergency Medicine | Admitting: Emergency Medicine

## 2021-08-22 DIAGNOSIS — R112 Nausea with vomiting, unspecified: Secondary | ICD-10-CM | POA: Insufficient documentation

## 2021-08-22 DIAGNOSIS — R1010 Upper abdominal pain, unspecified: Secondary | ICD-10-CM

## 2021-08-22 DIAGNOSIS — R1013 Epigastric pain: Secondary | ICD-10-CM | POA: Insufficient documentation

## 2021-08-22 DIAGNOSIS — R1012 Left upper quadrant pain: Secondary | ICD-10-CM | POA: Insufficient documentation

## 2021-08-22 DIAGNOSIS — R1011 Right upper quadrant pain: Secondary | ICD-10-CM | POA: Insufficient documentation

## 2021-08-22 LAB — COMPREHENSIVE METABOLIC PANEL
ALT: 25 U/L (ref 0–44)
AST: 19 U/L (ref 15–41)
Albumin: 4.9 g/dL (ref 3.5–5.0)
Alkaline Phosphatase: 60 U/L (ref 38–126)
Anion gap: 7 (ref 5–15)
BUN: 13 mg/dL (ref 6–20)
CO2: 23 mmol/L (ref 22–32)
Calcium: 9.6 mg/dL (ref 8.9–10.3)
Chloride: 103 mmol/L (ref 98–111)
Creatinine, Ser: 0.99 mg/dL (ref 0.61–1.24)
GFR, Estimated: >60 mL/min (ref >60)
Glucose, Bld: 108 mg/dL — ABNORMAL HIGH (ref 70–99)
Potassium: 3.3 mmol/L — ABNORMAL LOW (ref 3.5–5.1)
Sodium: 133 mmol/L — ABNORMAL LOW (ref 135–145)
Total Bilirubin: 1.2 mg/dL (ref 0.3–1.2)
Total Protein: 8 g/dL (ref 6.5–8.1)

## 2021-08-22 LAB — CBC WITH DIFFERENTIAL/PLATELET
Abs Immature Granulocytes: 0.09 10*3/uL — ABNORMAL HIGH (ref 0.00–0.07)
Basophils Absolute: 0 10*3/uL (ref 0.0–0.1)
Basophils Relative: 0 %
Eosinophils Absolute: 0 10*3/uL (ref 0.0–0.5)
Eosinophils Relative: 0 %
HCT: 46.4 % (ref 39.0–52.0)
Hemoglobin: 16.5 g/dL (ref 13.0–17.0)
Immature Granulocytes: 1 %
Lymphocytes Relative: 10 %
Lymphs Abs: 1.6 10*3/uL (ref 0.7–4.0)
MCH: 31.5 pg (ref 26.0–34.0)
MCHC: 35.6 g/dL (ref 30.0–36.0)
MCV: 88.7 fL (ref 80.0–100.0)
Monocytes Absolute: 1.3 10*3/uL — ABNORMAL HIGH (ref 0.1–1.0)
Monocytes Relative: 8 %
Neutro Abs: 12.7 10*3/uL — ABNORMAL HIGH (ref 1.7–7.7)
Neutrophils Relative %: 81 %
Platelets: 264 10*3/uL (ref 150–400)
RBC: 5.23 MIL/uL (ref 4.22–5.81)
RDW: 12.2 % (ref 11.5–15.5)
WBC: 15.7 10*3/uL — ABNORMAL HIGH (ref 4.0–10.5)
nRBC: 0 % (ref 0.0–0.2)

## 2021-08-22 LAB — LIPASE, BLOOD: Lipase: 35 U/L (ref 11–51)

## 2021-08-22 MED ORDER — CAPSAICIN 0.025 % EX CREA
TOPICAL_CREAM | Freq: Once | CUTANEOUS | Status: AC
  Filled 2021-08-22: qty 60

## 2021-08-22 MED ORDER — SODIUM CHLORIDE 0.9 % IV BOLUS
1000.0000 mL | Freq: Once | INTRAVENOUS | Status: AC
  Administered 2021-08-22: 1000 mL via INTRAVENOUS

## 2021-08-22 MED ORDER — FAMOTIDINE IN NACL 20-0.9 MG/50ML-% IV SOLN
20.0000 mg | Freq: Once | INTRAVENOUS | Status: AC
  Administered 2021-08-22: 20 mg via INTRAVENOUS
  Filled 2021-08-22: qty 50

## 2021-08-22 MED ORDER — ONDANSETRON HCL 4 MG/2ML IJ SOLN
4.0000 mg | Freq: Once | INTRAMUSCULAR | Status: AC
  Administered 2021-08-22: 4 mg via INTRAVENOUS
  Filled 2021-08-22: qty 2

## 2021-08-22 MED ORDER — DICYCLOMINE HCL 10 MG/ML IM SOLN
20.0000 mg | Freq: Once | INTRAMUSCULAR | Status: AC
  Administered 2021-08-22: 20 mg via INTRAMUSCULAR
  Filled 2021-08-22: qty 2

## 2021-08-22 MED ORDER — DROPERIDOL 2.5 MG/ML IJ SOLN
1.2500 mg | Freq: Once | INTRAMUSCULAR | Status: AC
  Administered 2021-08-22: 1.25 mg via INTRAVENOUS
  Filled 2021-08-22: qty 2

## 2021-08-22 MED ORDER — METOCLOPRAMIDE HCL 10 MG PO TABS: 10.0000 mg | ORAL_TABLET | Freq: Four times a day (QID) | ORAL | 0 refills | Status: AC

## 2021-08-22 MED ORDER — DICYCLOMINE HCL 20 MG PO TABS: 20.0000 mg | ORAL_TABLET | Freq: Two times a day (BID) | ORAL | 0 refills | Status: AC

## 2021-08-22 NOTE — ED Triage Notes (Signed)
Ems brings pt in for abdominal pain.pt reports upper abdominal pain and vomiting.

## 2021-08-22 NOTE — ED Provider Notes (Signed)
[facility]-EMERGENCY DEPT Provider Note   CSN: [id number] Arrival date & time: 08/22/21  8099     History  Chief Complaint  Patient presents with   Abdominal Pain    John Reynolds is a 26 y.o. male who presents to the ED today with complaint of gradual onset, constant, diffuse, upper abdominal pain that began 2 days ago. Pt also complains of nausea and NBNB emesis. Per chart review pt was seen at [facility] yesterday for same with reassuring labs and CT scan. He was treated with fluids, antiemetics, PPI, and pain medication however appears to have pulled out his IV and eloped. He tells me "they did nothing for me there" despite all of the interventions mentioned above. He reports having issues with this in the past - previous ED visits in November for same. Pt's UDS did return positive for THC yesterday. He does not see a gastroenterologist.   The history is provided by the patient and medical records.      Home Medications Prior to Admission medications   Medication Sig Start Date End Date Taking? Authorizing Provider  dicyclomine (BENTYL) 20 MG tablet Take 1 tablet (20 mg total) by mouth 2 (two) times daily. 08/22/21  Yes Ritika Hellickson, PA-C  metoCLOPramide (REGLAN) 10 MG tablet Take 1 tablet (10 mg total) by mouth every 6 (six) hours. 08/22/21  Yes Sharicka Pogorzelski, PA-C  ondansetron (ZOFRAN ODT) 4 MG disintegrating tablet Take 1 tablet (4 mg total) by mouth every 8 (eight) hours as needed for nausea or vomiting. 05/20/21   Pricilla Loveless, MD  pantoprazole (PROTONIX) 40 MG tablet Take 1 tablet (40 mg total) by mouth daily. 05/20/21   Pricilla Loveless, MD      Allergies    Patient has no known allergies.    Review of Systems   Review of Systems  Constitutional:  Negative for chills and fever.  Gastrointestinal:  Positive for abdominal pain, diarrhea (resolved), nausea and vomiting. Negative for constipation.  Genitourinary:  Negative for flank pain and frequency.   All other systems reviewed and are negative.  Physical Exam Updated Vital Signs BP 129/66    Pulse 61    Temp 98.3 F (36.8 C) (Oral)    Resp 11    Ht 6' (1.829 m)    Wt 77.1 kg    SpO2 99%    BMI 23.06 kg/m  Physical Exam Vitals and nursing note reviewed.  Constitutional:      Appearance: He is not ill-appearing or diaphoretic.  HENT:     Head: Normocephalic and atraumatic.  Eyes:     Conjunctiva/sclera: Conjunctivae normal.  Cardiovascular:     Rate and Rhythm: Normal rate and regular rhythm.  Pulmonary:     Effort: Pulmonary effort is normal.     Breath sounds: Normal breath sounds. No wheezing, rhonchi or rales.  Abdominal:     General: Abdomen is flat. Bowel sounds are normal.     Palpations: Abdomen is soft.     Tenderness: There is abdominal tenderness in the right upper quadrant, epigastric area and left upper quadrant. There is no right CVA tenderness, left CVA tenderness, guarding or rebound.  Musculoskeletal:     Cervical back: Neck supple.  Skin:    General: Skin is warm and dry.  Neurological:     Mental Status: He is alert.    ED Results / Procedures / Treatments   Labs (all labs ordered are listed, but only abnormal results are displayed) Labs Reviewed  COMPREHENSIVE METABOLIC PANEL - Abnormal; Notable for the following components:      Result Value   Sodium 133 (*)    Potassium 3.3 (*)    Glucose, Bld 108 (*)    All other components within normal limits  CBC WITH DIFFERENTIAL/PLATELET - Abnormal; Notable for the following components:   WBC 15.7 (*)    Neutro Abs 12.7 (*)    Monocytes Absolute 1.3 (*)    Abs Immature Granulocytes 0.09 (*)    All other components within normal limits  LIPASE, BLOOD    EKG EKG Interpretation  Date/Time:  Sunday August 22 2021 08:22:34 EST Ventricular Rate:  61 PR Interval:  115 QRS Duration: 96 QT Interval:  444 QTC Calculation: 448 R Axis:   89 Text Interpretation: Sinus rhythm Borderline short PR  interval Abnrm T, consider ischemia, anterolateral lds ST elev, probable normal early repol pattern No significant change since last tracing Confirmed by Jacalyn Lefevre [phone]) on 08/22/2021 8:25:12 AM  Radiology No results found.  Procedures Procedures    Medications Ordered in ED Medications  sodium chloride 0.9 % bolus 1,000 mL (0 mLs Intravenous Stopped 08/22/21 0858)  ondansetron (ZOFRAN) injection 4 mg (4 mg Intravenous Given 08/22/21 0754)  famotidine (PEPCID) IVPB 20 mg premix (0 mg Intravenous Stopped 08/22/21 0823)  dicyclomine (BENTYL) injection 20 mg (20 mg Intramuscular Given 08/22/21 0757)  droperidol (INAPSINE) 2.5 MG/ML injection 1.25 mg (1.25 mg Intravenous Given 08/22/21 0857)  capsaicin (ZOSTRIX) 0.025 % cream ( Topical Given 08/22/21 1007)    ED Course/ Medical Decision Making/ A&P                           Medical Decision Making 26 year old male who presents to the ED today with complaint of diffuse upper abdominal pain, nausea, vomiting x2 days with history of same.  Treated at [facility] yesterday with negative CT scan.  Positive THC on UDS.  On arrival to the ED today patient is afebrile, nontachycardic and nontachypneic.  He appears to be in no acute distress.  Is not actively vomiting at this time.  He is noted to have some mild upper abdominal tenderness palpation without lower abdominal tenderness palpation.  No rebound or guarding.  We will plan for fluids, antiemetics, Pepcid, Bentyl with repeat labs.  Patient will likely need referral to gastroenterology for further evaluation.   Pt initially provided with zofran, pepcid, bentyl and fluids however continued to vomit. EKG obtained and reduced dose of droperidol provided with good relief. On reeval pt sleeping.   10:08 AM Nursing staff informed patient feels improved and is ready to go home. Will discharge home with GI follow up. Pt instructed to refrain from marijuana use at this time. Discharged with  Reglan PO and Bentyl PO.   Amount and/or Complexity of Data Reviewed Labs: ordered.    Details: CBC with elevated WBC at 15,700. Slightly increased from yesterday at 13. Suspect secondary to vomiting. VSS and without fever. CT scan yesterday negative for acute findings.  CMP with glucose 108. Potassium 3.3. Sodium 133. No other electrolyte abnormalities. LFts unremarkable.  Lipase 35.  Risk OTC drugs. Prescription drug management.          Final Clinical Impression(s) / ED Diagnoses Final diagnoses:  Pain of upper abdomen  Nausea and vomiting, unspecified vomiting type    Rx / DC Orders ED Discharge Orders          Ordered  dicyclomine (BENTYL) 20 MG tablet  2 times daily        08/22/21 1009    metoCLOPramide (REGLAN) 10 MG tablet  Every 6 hours        08/22/21 1009             Discharge Instructions      Please pick up medications and take as prescribed  Follow up with [facility] for further evaluation        Tanda Rockers, PA-C 08/22/21 1010    Jacalyn Lefevre, MD 08/22/21 [phone]

## 2021-08-22 NOTE — Discharge Instructions (Addendum)
Please pick up medications and take as prescribed  Follow up with [facility] for further evaluation

## 2021-11-07 ENCOUNTER — Emergency Department (HOSPITAL_COMMUNITY): Payer: Self-pay

## 2021-11-07 ENCOUNTER — Emergency Department (HOSPITAL_COMMUNITY)
Admission: EM | Admit: 2021-11-07 | Discharge: 2021-11-07 | Disposition: A | Discharge status: Home / Self Care | Payer: Self-pay | Attending: Emergency Medicine | Admitting: Emergency Medicine

## 2021-11-07 ENCOUNTER — Encounter (HOSPITAL_COMMUNITY): Payer: Self-pay | Admitting: Oncology

## 2021-11-07 ENCOUNTER — Other Ambulatory Visit: Payer: Self-pay

## 2021-11-07 DIAGNOSIS — R110 Nausea: Secondary | ICD-10-CM | POA: Insufficient documentation

## 2021-11-07 DIAGNOSIS — R1084 Generalized abdominal pain: Secondary | ICD-10-CM | POA: Insufficient documentation

## 2021-11-07 LAB — COMPREHENSIVE METABOLIC PANEL
ALT: 38 U/L (ref 0–44)
AST: 20 U/L (ref 15–41)
Albumin: 4.8 g/dL (ref 3.5–5.0)
Alkaline Phosphatase: 80 U/L (ref 38–126)
Anion gap: 12 (ref 5–15)
BUN: 9 mg/dL (ref 6–20)
CO2: 27 mmol/L (ref 22–32)
Calcium: 9.9 mg/dL (ref 8.9–10.3)
Chloride: 97 mmol/L — ABNORMAL LOW (ref 98–111)
Creatinine, Ser: 1.05 mg/dL (ref 0.61–1.24)
GFR, Estimated: >60 mL/min (ref >60)
Glucose, Bld: 104 mg/dL — ABNORMAL HIGH (ref 70–99)
Potassium: 2.8 mmol/L — ABNORMAL LOW (ref 3.5–5.1)
Sodium: 136 mmol/L (ref 135–145)
Total Bilirubin: 1.6 mg/dL — ABNORMAL HIGH (ref 0.3–1.2)
Total Protein: 8.4 g/dL — ABNORMAL HIGH (ref 6.5–8.1)

## 2021-11-07 LAB — URINALYSIS, ROUTINE W REFLEX MICROSCOPIC
Bacteria, UA: NONE SEEN
Bilirubin Urine: NEGATIVE
Glucose, UA: NEGATIVE mg/dL
Hgb urine dipstick: NEGATIVE
Ketones, ur: 80 mg/dL — AB
Leukocytes,Ua: NEGATIVE
Nitrite: NEGATIVE
Protein, ur: 30 mg/dL — AB
Specific Gravity, Urine: 1.016 (ref 1.005–1.030)
pH: 7 (ref 5.0–8.0)

## 2021-11-07 LAB — CBC
HCT: 50.9 % (ref 39.0–52.0)
Hemoglobin: 18.8 g/dL — ABNORMAL HIGH (ref 13.0–17.0)
MCH: 31.9 pg (ref 26.0–34.0)
MCHC: 36.9 g/dL — ABNORMAL HIGH (ref 30.0–36.0)
MCV: 86.4 fL (ref 80.0–100.0)
Platelets: 306 10*3/uL (ref 150–400)
RBC: 5.89 MIL/uL — ABNORMAL HIGH (ref 4.22–5.81)
RDW: 11.4 % — ABNORMAL LOW (ref 11.5–15.5)
WBC: 17.5 10*3/uL — ABNORMAL HIGH (ref 4.0–10.5)
nRBC: 0 % (ref 0.0–0.2)

## 2021-11-07 LAB — LIPASE, BLOOD: Lipase: 30 U/L (ref 11–51)

## 2021-11-07 MED ORDER — IOHEXOL 300 MG/ML  SOLN
100.0000 mL | Freq: Once | INTRAMUSCULAR | Status: AC | PRN
  Administered 2021-11-07: 100 mL via INTRAVENOUS

## 2021-11-07 MED ORDER — POTASSIUM CHLORIDE CRYS ER 20 MEQ PO TBCR
40.0000 meq | EXTENDED_RELEASE_TABLET | Freq: Once | ORAL | Status: AC
  Administered 2021-11-07: 40 meq via ORAL
  Filled 2021-11-07: qty 2

## 2021-11-07 MED ORDER — ALUM & MAG HYDROXIDE-SIMETH 200-200-20 MG/5ML PO SUSP
30.0000 mL | Freq: Once | ORAL | Status: AC
Start: 2021-11-07 — End: 2021-11-07
  Administered 2021-11-07: 30 mL via ORAL
  Filled 2021-11-07: qty 30

## 2021-11-07 MED ORDER — SODIUM CHLORIDE 0.9 % IV BOLUS: 1000.0000 mL | Freq: Once | INTRAVENOUS | Status: DC

## 2021-11-07 MED ORDER — SODIUM CHLORIDE (PF) 0.9 % IJ SOLN
INTRAMUSCULAR | Status: AC
  Filled 2021-11-07: qty 50

## 2021-11-07 MED ORDER — HYOSCYAMINE SULFATE SL 0.125 MG SL SUBL: SUBLINGUAL_TABLET | SUBLINGUAL | 0 refills | Status: AC

## 2021-11-07 MED ORDER — LIDOCAINE VISCOUS HCL 2 % MT SOLN
15.0000 mL | Freq: Once | OROMUCOSAL | Status: AC
  Administered 2021-11-07: 15 mL via ORAL
  Filled 2021-11-07: qty 15

## 2021-11-07 MED ORDER — POTASSIUM CHLORIDE CRYS ER 20 MEQ PO TBCR: 20.0000 meq | EXTENDED_RELEASE_TABLET | Freq: Every day | ORAL | 0 refills | Status: AC

## 2021-11-07 NOTE — Discharge Instructions (Addendum)
Stop drinking and using marijuana products.  Take potassium supplement.  Follow up with gi for evaltuion  ?

## 2021-11-07 NOTE — ED Provider Notes (Signed)
?[facility] DEPT ?Provider Note ? ? ?CSN: [id number] ?Arrival date & time: 11/07/21  1637 ? ?  ? ?History ? ?Chief Complaint  ?Patient presents with  ? Abdominal Pain  ? ? ?John Reynolds is a 26 y.o. male. ? ?Pt complains of diffuse abdominal pain.  Pt has been having pain on and off for several months.   ? ?The history is provided by the patient. No language interpreter was used.  ?Abdominal Pain ?Pain location:  Generalized ?Pain quality: aching   ?Pain radiates to:  Does not radiate ?Pain severity:  Moderate ?Onset quality:  Gradual ?Duration:  3 days ?Timing:  Constant ?Progression:  Worsening ?Chronicity:  New ?Context: not sick contacts   ?Relieved by:  Nothing ?Worsened by:  Nothing ?Ineffective treatments:  None tried ?Associated symptoms: nausea   ? ?  ? ?Home Medications ?Prior to Admission medications   ?Medication Sig Start Date End Date Taking? Authorizing Provider  ?Hyoscyamine Sulfate SL (LEVSIN/SL) 0.125 MG SUBL One tablet every 4 hours as needed 11/07/21  Yes Caryl Ada K, PA-C  ?potassium chloride SA (KLOR-CON M) 20 MEQ tablet Take 1 tablet (20 mEq total) by mouth daily. 11/07/21  Yes Caryl Ada K, PA-C  ?dicyclomine (BENTYL) 20 MG tablet Take 1 tablet (20 mg total) by mouth 2 (two) times daily. 08/22/21   Eustaquio Maize, PA-C  ?metoCLOPramide (REGLAN) 10 MG tablet Take 1 tablet (10 mg total) by mouth every 6 (six) hours. 08/22/21   Eustaquio Maize, PA-C  ?ondansetron (ZOFRAN ODT) 4 MG disintegrating tablet Take 1 tablet (4 mg total) by mouth every 8 (eight) hours as needed for nausea or vomiting. 05/20/21   Sherwood Gambler, MD  ?pantoprazole (PROTONIX) 40 MG tablet Take 1 tablet (40 mg total) by mouth daily. 05/20/21   Sherwood Gambler, MD  ?   ? ?Allergies    ?Patient has no known allergies.   ? ?Review of Systems   ?Review of Systems  ?Gastrointestinal:  Positive for abdominal pain and nausea.  ?All other systems reviewed and are negative. ? ?Physical Exam ?Updated  Vital Signs ?BP 124/84 (BP Location: Right Arm)   Pulse 85   Temp 98.8 ?F (37.1 ?C) (Oral)   Resp 16   Ht 6' (1.829 m)   Wt 79.4 kg   SpO2 100%   BMI 23.73 kg/m?  ?Physical Exam ?Vitals and nursing note reviewed.  ?Constitutional:   ?   General: He is not in acute distress. ?   Appearance: He is well-developed.  ?HENT:  ?   Head: Normocephalic and atraumatic.  ?Eyes:  ?   Extraocular Movements: Extraocular movements intact.  ?   Conjunctiva/sclera: Conjunctivae normal.  ?   Pupils: Pupils are equal, round, and reactive to light.  ?Cardiovascular:  ?   Rate and Rhythm: Normal rate and regular rhythm.  ?   Heart sounds: No murmur heard. ?Pulmonary:  ?   Effort: Pulmonary effort is normal. No respiratory distress.  ?   Breath sounds: Normal breath sounds.  ?Abdominal:  ?   General: Abdomen is flat. Bowel sounds are increased.  ?   Palpations: Abdomen is soft.  ?   Tenderness: There is abdominal tenderness.  ?Musculoskeletal:     ?   General: No swelling.  ?   Cervical back: Neck supple.  ?Skin: ?   General: Skin is warm and dry.  ?   Capillary Refill: Capillary refill takes less than 2 seconds.  ?Neurological:  ?   Mental Status:  He is alert.  ?Psychiatric:     ?   Mood and Affect: Mood normal.     ?   Behavior: Behavior normal.  ? ? ?ED Results / Procedures / Treatments   ?Labs ?(all labs ordered are listed, but only abnormal results are displayed) ?Labs Reviewed  ?COMPREHENSIVE METABOLIC PANEL - Abnormal; Notable for the following components:  ?    Result Value  ? Potassium 2.8 (*)   ? Chloride 97 (*)   ? Glucose, Bld 104 (*)   ? Total Protein 8.4 (*)   ? Total Bilirubin 1.6 (*)   ? All other components within normal limits  ?CBC - Abnormal; Notable for the following components:  ? WBC 17.5 (*)   ? RBC 5.89 (*)   ? Hemoglobin 18.8 (*)   ? MCHC 36.9 (*)   ? RDW 11.4 (*)   ? All other components within normal limits  ?URINALYSIS, ROUTINE W REFLEX MICROSCOPIC - Abnormal; Notable for the following components:  ?  Ketones, ur 80 (*)   ? Protein, ur 30 (*)   ? All other components within normal limits  ?LIPASE, BLOOD  ? ? ?EKG ?None ? ?Radiology ?CT ABDOMEN PELVIS W CONTRAST ? ?Result Date: 11/07/2021 ?CLINICAL DATA:  Acute abdominal pain. EXAM: CT ABDOMEN AND PELVIS WITH CONTRAST TECHNIQUE: Multidetector CT imaging of the abdomen and pelvis was performed using the standard protocol following bolus administration of intravenous contrast. RADIATION DOSE REDUCTION: This exam was performed according to the departmental dose-optimization program which includes automated exposure control, adjustment of the mA and/or kV according to patient size and/or use of iterative reconstruction technique. CONTRAST:  172mL OMNIPAQUE IOHEXOL 300 MG/ML  SOLN COMPARISON:  CT abdomen and pelvis 05/20/2021. FINDINGS: Lower chest: No acute abnormality. Hepatobiliary: No focal liver abnormality is seen. No gallstones, gallbladder wall thickening, or biliary dilatation. Pancreas: Unremarkable. No pancreatic ductal dilatation or surrounding inflammatory changes. Spleen: Normal in size without focal abnormality. Adrenals/Urinary Tract: Adrenal glands are unremarkable. Kidneys are normal, without renal calculi, focal lesion, or hydronephrosis. Bladder is unremarkable. Stomach/Bowel: Stomach is within normal limits. Appendix appears normal. No evidence of bowel wall thickening, distention, or inflammatory changes. Vascular/Lymphatic: No significant vascular findings are present. No enlarged abdominal or pelvic lymph nodes. Reproductive: Prostate is unremarkable. Other: No abdominal wall hernia or abnormality. No abdominopelvic ascites. Musculoskeletal: No acute or significant osseous findings. IMPRESSION: No acute localizing process in the abdomen or pelvis. Electronically Signed   By: Ronney Asters M.D.   On: 11/07/2021 19:36   ? ?Procedures ?Procedures  ? ? ?Medications Ordered in ED ?Medications  ?sodium chloride (PF) 0.9 % injection (has no  administration in time range)  ?sodium chloride 0.9 % bolus 1,000 mL (has no administration in time range)  ?alum & mag hydroxide-simeth (MAALOX/MYLANTA) 200-200-20 MG/5ML suspension 30 mL (has no administration in time range)  ?  And  ?lidocaine (XYLOCAINE) 2 % viscous mouth solution 15 mL (has no administration in time range)  ?potassium chloride SA (KLOR-CON M) CR tablet 40 mEq (40 mEq Oral Given 11/07/21 1932)  ?iohexol (OMNIPAQUE) 300 MG/ML solution 100 mL (100 mLs Intravenous Contrast Given 11/07/21 1922)  ? ? ?ED Course/ Medical Decision Making/ A&P ?Clinical Course as of 11/07/21 2132  ?Sun Nov 07, 2021  ?1907 CT ABDOMEN PELVIS W CONTRAST [LS]  ?  ?Clinical Course User Index ?[LS] Fransico Meadow, PA-C  ? ?                        ?  Medical Decision Making ?Pt has had abdominal pain on and off for months.  Pt reports increased pain over 3 days.  Pt reports he has been drinking gatoraid but not eating well.  No fever or chills  ? ?Amount and/or Complexity of Data Reviewed ?External Data Reviewed: notes. ?   Details: Previous ed notes reviewed ?Labs: ordered. Decision-making details documented in ED Course. ?   Details: labs ordered reviewed and intepreted.  Pt has a potassium of 2.8  Pt has an elevated wbc of 17.5 ?Radiology: ordered and independent interpretation performed. Decision-making details documented in ED Course. ?   Details: Ct scan of abdomen shows no acute abnormality ? ?Risk ?OTC drugs. ?Prescription drug management. ?Risk Details: Patient's evaluation not suggestive for acute abdomen.  Differential diagnosis gastritis colitis cholecystitis appendicitis gastroenteritis gastroparesis.  Patient's CT scan is obtained and reviewed CT is normal ? ?.  Potassium is 2.8 patient has an elevated white blood cell count. ?Patient given 40 mg milliequivalents of potassium here.  I discussed with patient my concerns that his symptoms may be secondary to his alcohol use as well as marijuana use.  I advised patient  I feel like he should probably stop consuming alcohol stop marijuana to see if this improves his symptoms I will try him on Levsin.  I have advised him that he should schedule an appointment with gastroenterology for f

## 2021-11-07 NOTE — ED Triage Notes (Signed)
BIBA ?Per EMS: Pt coming from home w/ c/o upper abd pain x5days ; N/V  ?Hx ulcer ?VSS ?86 HR  ?118 palp ? ?

## 2021-11-08 ENCOUNTER — Encounter (HOSPITAL_COMMUNITY): Payer: Self-pay | Admitting: Emergency Medicine

## 2021-11-08 ENCOUNTER — Emergency Department (HOSPITAL_COMMUNITY)
Admission: EM | Admit: 2021-11-08 | Discharge: 2021-11-08 | Discharge status: Against Medical Advice | Payer: Medicaid Other | Attending: Emergency Medicine | Admitting: Emergency Medicine

## 2021-11-08 DIAGNOSIS — R197 Diarrhea, unspecified: Secondary | ICD-10-CM | POA: Insufficient documentation

## 2021-11-08 DIAGNOSIS — Z5321 Procedure and treatment not carried out due to patient leaving prior to being seen by health care provider: Secondary | ICD-10-CM | POA: Insufficient documentation

## 2021-11-08 DIAGNOSIS — R112 Nausea with vomiting, unspecified: Secondary | ICD-10-CM | POA: Insufficient documentation

## 2021-11-08 DIAGNOSIS — R1012 Left upper quadrant pain: Secondary | ICD-10-CM | POA: Insufficient documentation

## 2021-11-08 LAB — URINALYSIS, ROUTINE W REFLEX MICROSCOPIC
Bilirubin Urine: NEGATIVE
Glucose, UA: NEGATIVE mg/dL
Hgb urine dipstick: NEGATIVE
Ketones, ur: NEGATIVE mg/dL
Leukocytes,Ua: NEGATIVE
Nitrite: NEGATIVE
Protein, ur: NEGATIVE mg/dL
Specific Gravity, Urine: 1.008 (ref 1.005–1.030)
pH: 7 (ref 5.0–8.0)

## 2021-11-08 LAB — COMPREHENSIVE METABOLIC PANEL
ALT: 34 U/L (ref 0–44)
AST: 18 U/L (ref 15–41)
Albumin: 4.8 g/dL (ref 3.5–5.0)
Alkaline Phosphatase: 76 U/L (ref 38–126)
Anion gap: 10 (ref 5–15)
BUN: 11 mg/dL (ref 6–20)
CO2: 24 mmol/L (ref 22–32)
Calcium: 9.8 mg/dL (ref 8.9–10.3)
Chloride: 99 mmol/L (ref 98–111)
Creatinine, Ser: 1.02 mg/dL (ref 0.61–1.24)
GFR, Estimated: >60 mL/min (ref >60)
Glucose, Bld: 92 mg/dL (ref 70–99)
Potassium: 3.3 mmol/L — ABNORMAL LOW (ref 3.5–5.1)
Sodium: 133 mmol/L — ABNORMAL LOW (ref 135–145)
Total Bilirubin: 1.5 mg/dL — ABNORMAL HIGH (ref 0.3–1.2)
Total Protein: 8.4 g/dL — ABNORMAL HIGH (ref 6.5–8.1)

## 2021-11-08 LAB — CBC
HCT: 50.8 % (ref 39.0–52.0)
Hemoglobin: 18.6 g/dL — ABNORMAL HIGH (ref 13.0–17.0)
MCH: 31.8 pg (ref 26.0–34.0)
MCHC: 36.6 g/dL — ABNORMAL HIGH (ref 30.0–36.0)
MCV: 87 fL (ref 80.0–100.0)
Platelets: 287 10*3/uL (ref 150–400)
RBC: 5.84 MIL/uL — ABNORMAL HIGH (ref 4.22–5.81)
RDW: 11.8 % (ref 11.5–15.5)
WBC: 17.2 10*3/uL — ABNORMAL HIGH (ref 4.0–10.5)
nRBC: 0 % (ref 0.0–0.2)

## 2021-11-08 LAB — LIPASE, BLOOD: Lipase: 39 U/L (ref 11–51)

## 2021-11-08 NOTE — ED Provider Triage Note (Signed)
Emergency Medicine Provider Triage Evaluation Note ? ?John Reynolds , a 26 y.o. male  was evaluated in triage.  Pt complains of recurrent abdominal pain and vomiting.  Was seen for the same last night reports he improved with symptomatic treatment was discharged and tried to drink plenty of fluids but had recurrence of vomiting and pain when he got up this morning.  Reports a small streak of blood and 1 episode of emesis but none since then, continues to have pain most severe in the left upper quadrant.  Does endorse using marijuana previously but none this morning.. ? ?Review of Systems  ?Positive: Abdominal pain, vomiting ?Negative: Fever, chest pain ? ?Physical Exam  ?BP 131/88 (BP Location: Right Arm)   Pulse 67   Temp 99.1 ?F (37.3 ?C) (Oral)   Resp 20   SpO2 95%  ?Gen:   Awake, no distress   ?Resp:  Normal effort  ?MSK:   Moves extremities without difficulty  ?Other:  Tenderness across the upper abdomen ? ?Medical Decision Making  ?Medically screening exam initiated at 12:13 PM.  Appropriate orders placed.  Derric Dealmeida was informed that the remainder of the evaluation will be completed by another provider, this initial triage assessment does not replace that evaluation, and the importance of remaining in the ED until their evaluation is complete. ? ?  ?Dartha Lodge, PA-C ?11/08/21 1214 ? ?

## 2021-11-08 NOTE — ED Notes (Signed)
Pt called x1 in ED lobby. Pt could not be located within ED lobby, and did not respond to name call.   °

## 2021-11-08 NOTE — ED Triage Notes (Addendum)
Per EMS, patient from home, N/V/D, seen for same yesterday. Patient states he was unable to get his prescriptions. ?
